# Patient Record
Sex: Female | Born: 1955 | Race: Black or African American | Hispanic: No | Marital: Single | Smoking: Never smoker
Health system: Southern US, Community
[De-identification: ages and names within clinical notes are randomized; demographics above are authoritative.]

---

## 2019-12-20 ENCOUNTER — Encounter (HOSPITAL_BASED_OUTPATIENT_CLINIC_OR_DEPARTMENT_OTHER): Payer: Self-pay | Admitting: Emergency Medicine

## 2019-12-20 ENCOUNTER — Emergency Department (HOSPITAL_BASED_OUTPATIENT_CLINIC_OR_DEPARTMENT_OTHER): Payer: Self-pay

## 2019-12-20 ENCOUNTER — Other Ambulatory Visit: Payer: Self-pay

## 2019-12-20 ENCOUNTER — Emergency Department (HOSPITAL_BASED_OUTPATIENT_CLINIC_OR_DEPARTMENT_OTHER)
Admission: EM | Admit: 2019-12-20 | Discharge: 2019-12-20 | Disposition: A | Discharge status: Home / Self Care | Payer: Self-pay | Attending: Emergency Medicine | Admitting: Emergency Medicine

## 2019-12-20 DIAGNOSIS — M25561 Pain in right knee: Secondary | ICD-10-CM | POA: Insufficient documentation

## 2019-12-20 DIAGNOSIS — R202 Paresthesia of skin: Secondary | ICD-10-CM | POA: Insufficient documentation

## 2019-12-20 DIAGNOSIS — M25511 Pain in right shoulder: Secondary | ICD-10-CM | POA: Insufficient documentation

## 2019-12-20 MED ORDER — ACETAMINOPHEN 500 MG PO TABS
1000.0000 mg | ORAL_TABLET | Freq: Once | ORAL | Status: AC
  Administered 2019-12-20: 1000 mg via ORAL
  Filled 2019-12-20: qty 2

## 2019-12-20 NOTE — ED Triage Notes (Signed)
Pt states she has been having right knee pain for 2 weeks.  Also having right shoulder pain for 2 weeks.  Pt also having numbness in fingers and toes intermittently for one month.  No weakness, no facial droop.

## 2019-12-20 NOTE — ED Provider Notes (Signed)
[facility] Provider Note   CSN: [id number] Arrival date & time: 12/20/19  9892     History Chief Complaint  Patient presents with  . Shoulder Pain  . Knee Pain  . Numbness    fingertips and toes    Latoya Ruiz is a 64 y.o. female.  Latoya Ruiz is a 64 y.o. female who is otherwise healthy, presents to the emergency department for evaluation of right knee pain, right shoulder pain and some intermittent numbness in the right fingertips and toes.  She states that a few weeks ago she noticed that her right knee was a bit swollen after being at work all day and looked like it had some fluid on the knee, she started wearing a knee brace and the swelling seemed to go away but she still has some pain over the right knee, she is able to walk without difficulty and denies any pain at the ankle or hip.  She has not noticed any redness or warmth.  She reports that this week she also started to have some right shoulder pain.  She reports that is most painful when she tries to reach overhead she denies any injury or trauma to the shoulder.  She denies any associated redness, swelling, no fevers.  She also states that over the past few weeks intermittently when she wakes up in the morning she notices some numbness in the first 3 fingers of her right hand and as well as the toes of her right foot.  She reports when she gets up and starts to move around this numbness goes away and does not recur throughout the day.  She denies any associated weakness.  No neck or back pain.  No headache or vision changes.  No chest pain or shortness of breath.  She has not taken any medications to treat the symptoms prior to arrival.  No other aggravating or alleviating factors. Pt does report a family history of rheumatoid arthritis.        No past medical history on file.  There are no problems to display for this patient.    OB History   No obstetric history on file.     No  family history on file.  Social History   Tobacco Use  . Smoking status: Never Smoker  . Smokeless tobacco: Never Used  Substance Use Topics  . Alcohol use: Yes    Comment: occasional  . Drug use: Never    Home Medications Prior to Admission medications   Not on File    Allergies    Patient has no known allergies.  Review of Systems   Review of Systems  Constitutional: Negative for chills and fever.  Respiratory: Negative for shortness of breath.   Cardiovascular: Negative for chest pain.  Musculoskeletal: Positive for arthralgias and joint swelling.  Skin: Negative for color change and rash.  Neurological: Positive for numbness. Negative for weakness and headaches.  All other systems reviewed and are negative.   Physical Exam Updated Vital Signs BP (!) 194/96   Pulse 78   Temp 98.4 F (36.9 C) (Oral)   Resp 16   Ht 5\' 6"  (1.676 m)   Wt 74.4 kg   SpO2 100%   BMI 26.47 kg/m   Physical Exam Vitals and nursing note reviewed.  Constitutional:      General: She is not in acute distress.    Appearance: She is well-developed. She is not diaphoretic.  HENT:  Head: Normocephalic and atraumatic.  Eyes:     General:        Right eye: No discharge.        Left eye: No discharge.  Neck:     Comments: No midline or paraspinal tenderness. Cardiovascular:     Rate and Rhythm: Normal rate and regular rhythm.     Heart sounds: Normal heart sounds. No murmur.  Pulmonary:     Effort: Pulmonary effort is normal. No respiratory distress.     Breath sounds: Normal breath sounds.     Comments: Respirations equal and unlabored, patient able to speak in full sentences, lungs clear to auscultation bilaterally Musculoskeletal:     Cervical back: No tenderness.     Comments: Right shoulder with tenderness to palpation over the glenohumeral joint, but no obvious deformity or swelling, no overlying erythema or warmth.  Active and passive range of motion intact with pain while  reaching overhead, no joint instability noted Right knee mildly tender to palpation over the anterior medial aspect, minimal swelling noted, no erythema or warmth, able to flex and extend greater than 90 degrees  Skin:    General: Skin is warm and dry.     Findings: No erythema or rash.  Neurological:     Mental Status: She is alert and oriented to person, place, and time.     Coordination: Coordination normal.  Psychiatric:        Mood and Affect: Mood normal.        Behavior: Behavior normal.     ED Results / Procedures / Treatments   Labs (all labs ordered are listed, but only abnormal results are displayed) Labs Reviewed - No data to display  EKG None  Radiology DG Shoulder Right  Result Date: 12/20/2019 CLINICAL DATA:  Right shoulder pain for 1 month EXAM: RIGHT SHOULDER - 2+ VIEW COMPARISON:  None. FINDINGS: There is no evidence of fracture or dislocation. Probable os acromiale. There is no evidence of arthropathy or other focal bone abnormality. Soft tissues are unremarkable. IMPRESSION: Negative. Electronically Signed   By: Duanne Guess D.O.   On: 12/20/2019 09:37   DG Knee Complete 4 Views Right  Result Date: 12/20/2019 CLINICAL DATA:  Right knee pain for 1 month EXAM: RIGHT KNEE - COMPLETE 4+ VIEW COMPARISON:  None. FINDINGS: No acute fracture or malalignment. Mild tricompartmental degenerative changes. Tiny enthesophyte at the quadriceps tendon insertion. Small knee joint effusion. Soft tissues within normal limits. IMPRESSION: 1. No acute osseous abnormality. 2. Mild tricompartmental degenerative changes. 3. Small knee joint effusion, nonspecific. Electronically Signed   By: Duanne Guess D.O.   On: 12/20/2019 09:39    Procedures Procedures (including critical care time)  Medications Ordered in ED Medications  acetaminophen (TYLENOL) tablet 1,000 mg (1,000 mg Oral Given 12/20/19 9628)    ED Course  I have reviewed the triage vital signs and the nursing  notes.  Pertinent labs & imaging results that were available during my care of the patient were reviewed by me and considered in my medical decision making (see chart for details).    MDM Rules/Calculators/A&P                      64 year old female presents with right knee pain and right shoulder pain, knee pain started a few weeks ago and shoulder pain began this week, she had some swelling over the right knee that has since improved.  Both joints without deformity or signs of infection  on exam, full range of motion intact.  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  Patient reports that sometimes when she wakes up from sleep but she has some numbness in the first 3 fingers of her right hand and occasionally her toes this goes away as soon as she gets up and starts moving around and does not continue, question whether she may have some carpal tunnel or other nerve inflammation, but she has no other neurologic deficits and has normal sensation and strength today.  Will have patient follow-up with sports medicine for continued evaluation of joint pains, will have her use Tylenol and Voltaren gel.  Return precautions discussed.  Patient expresses understanding and agreement with plan.  Discharged home in good condition.  Final Clinical Impression(s) / ED Diagnoses Final diagnoses:  Acute pain of right knee  Acute pain of right shoulder    Rx / DC Orders ED Discharge Orders    None       Legrand Rams 12/20/19 1013    Raeford Razor, MD 12/21/19 [phone]

## 2019-12-20 NOTE — Discharge Instructions (Addendum)
Your x-rays today shows some mild arthritis in your knee but are otherwise reassuring, please begin taking 1000 mg of Tylenol up to 3 times daily, and using over-the-counter Voltaren gel over your right knee and shoulder, you can continue to use knee brace to give support.  Please follow-up with sports medicine and/or use the phone number provided to establish care with her regular doctor.  If you develop significantly worsened swelling, redness, warmth or fevers, or have persistent numbness or tingling, return to the ED for reevaluation.

## 2021-10-11 IMAGING — CR DG SHOULDER 2+V*R*
3 series · 3 of 3 positions shown · non-contrast
Comparison: None.

CLINICAL DATA: Right shoulder pain for 1 month

EXAM:
RIGHT SHOULDER - 2+ VIEW

[w shoulder grashey right]
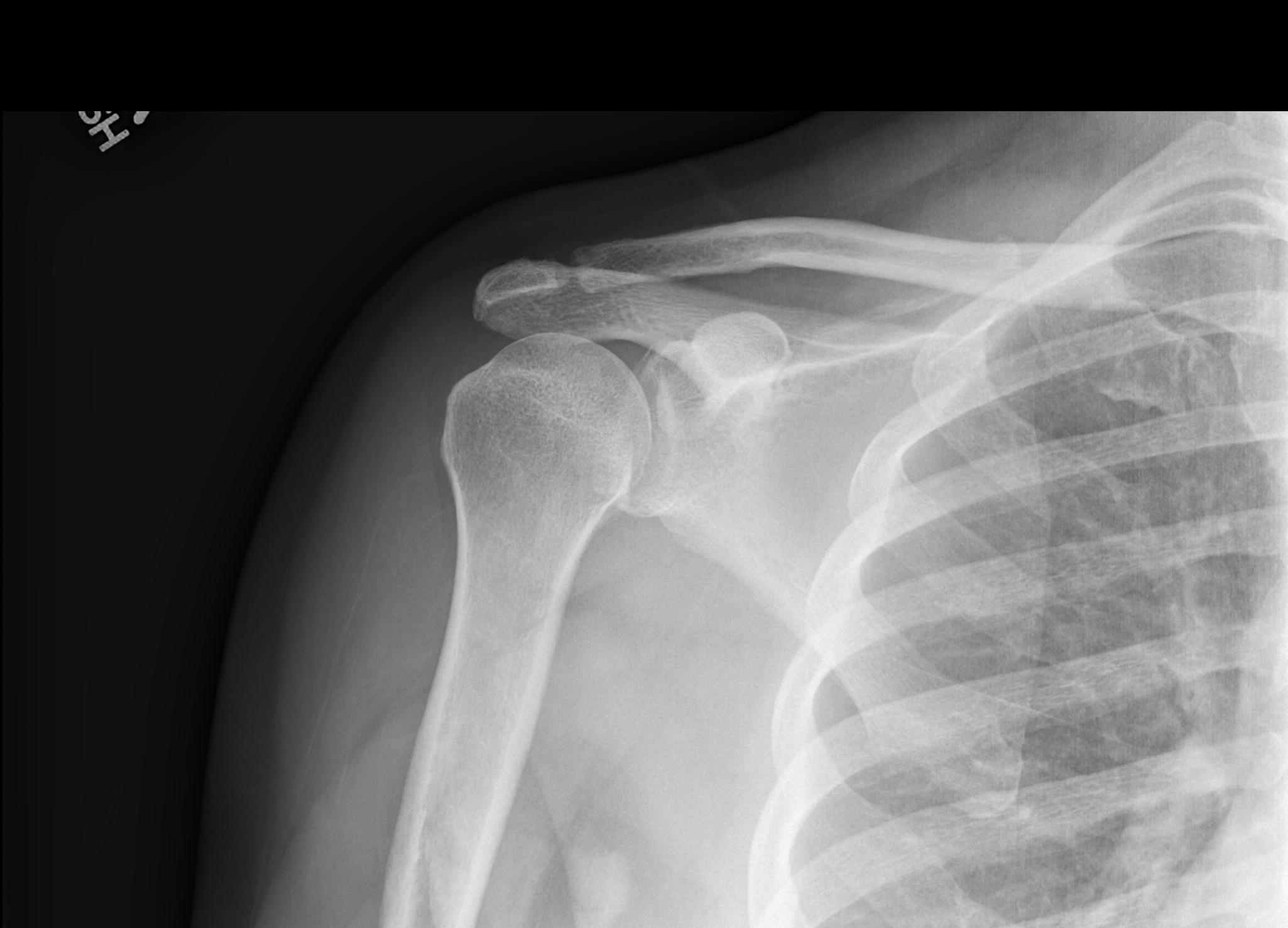

[w shoulder y view right]
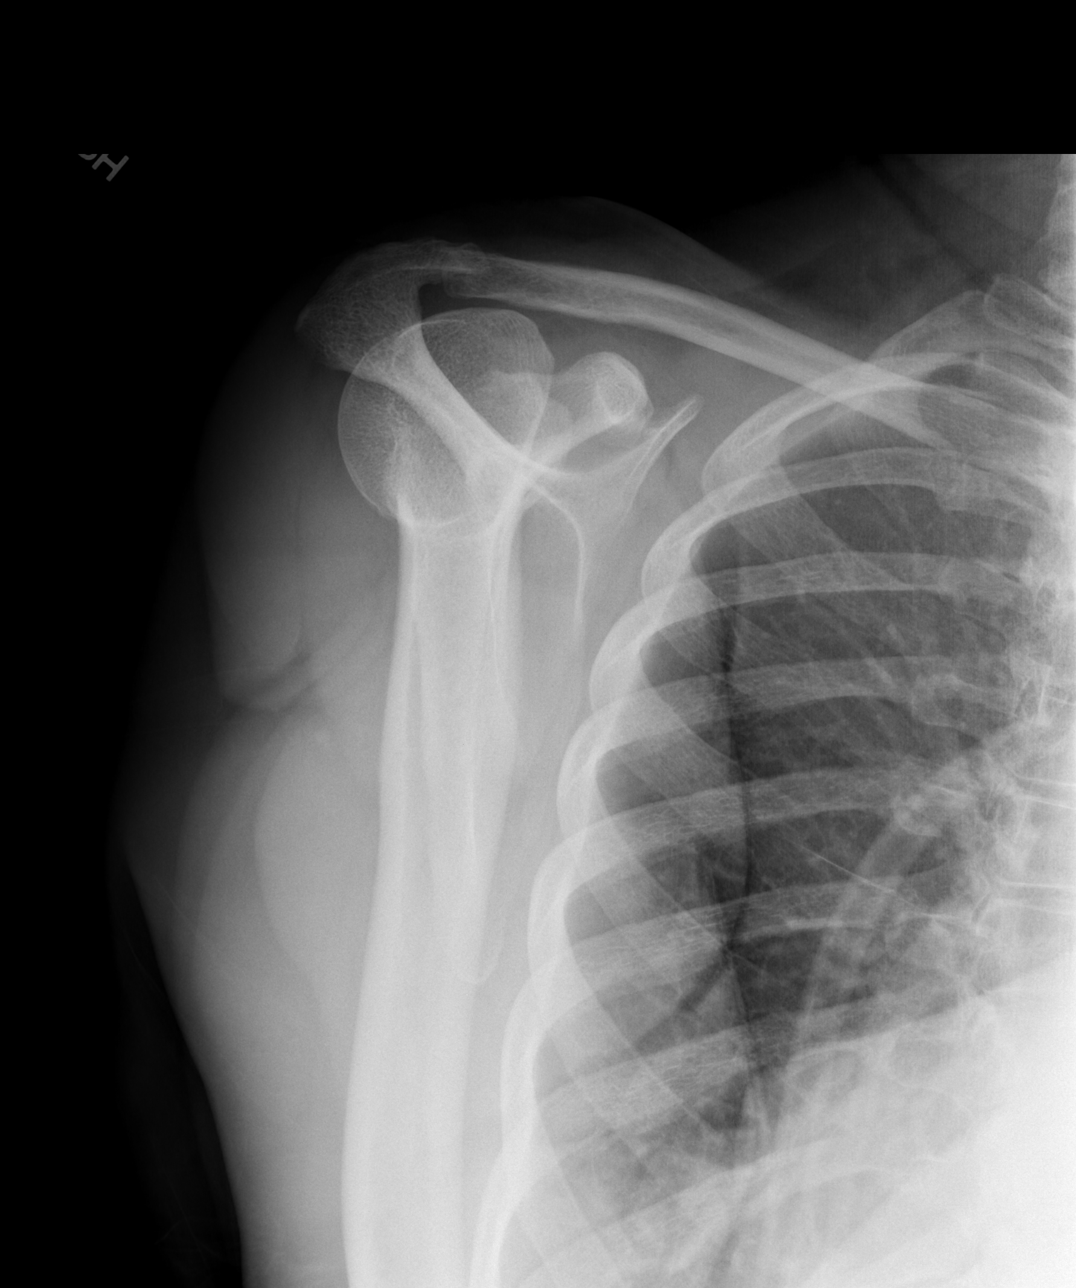

[x shoulder axillary right]
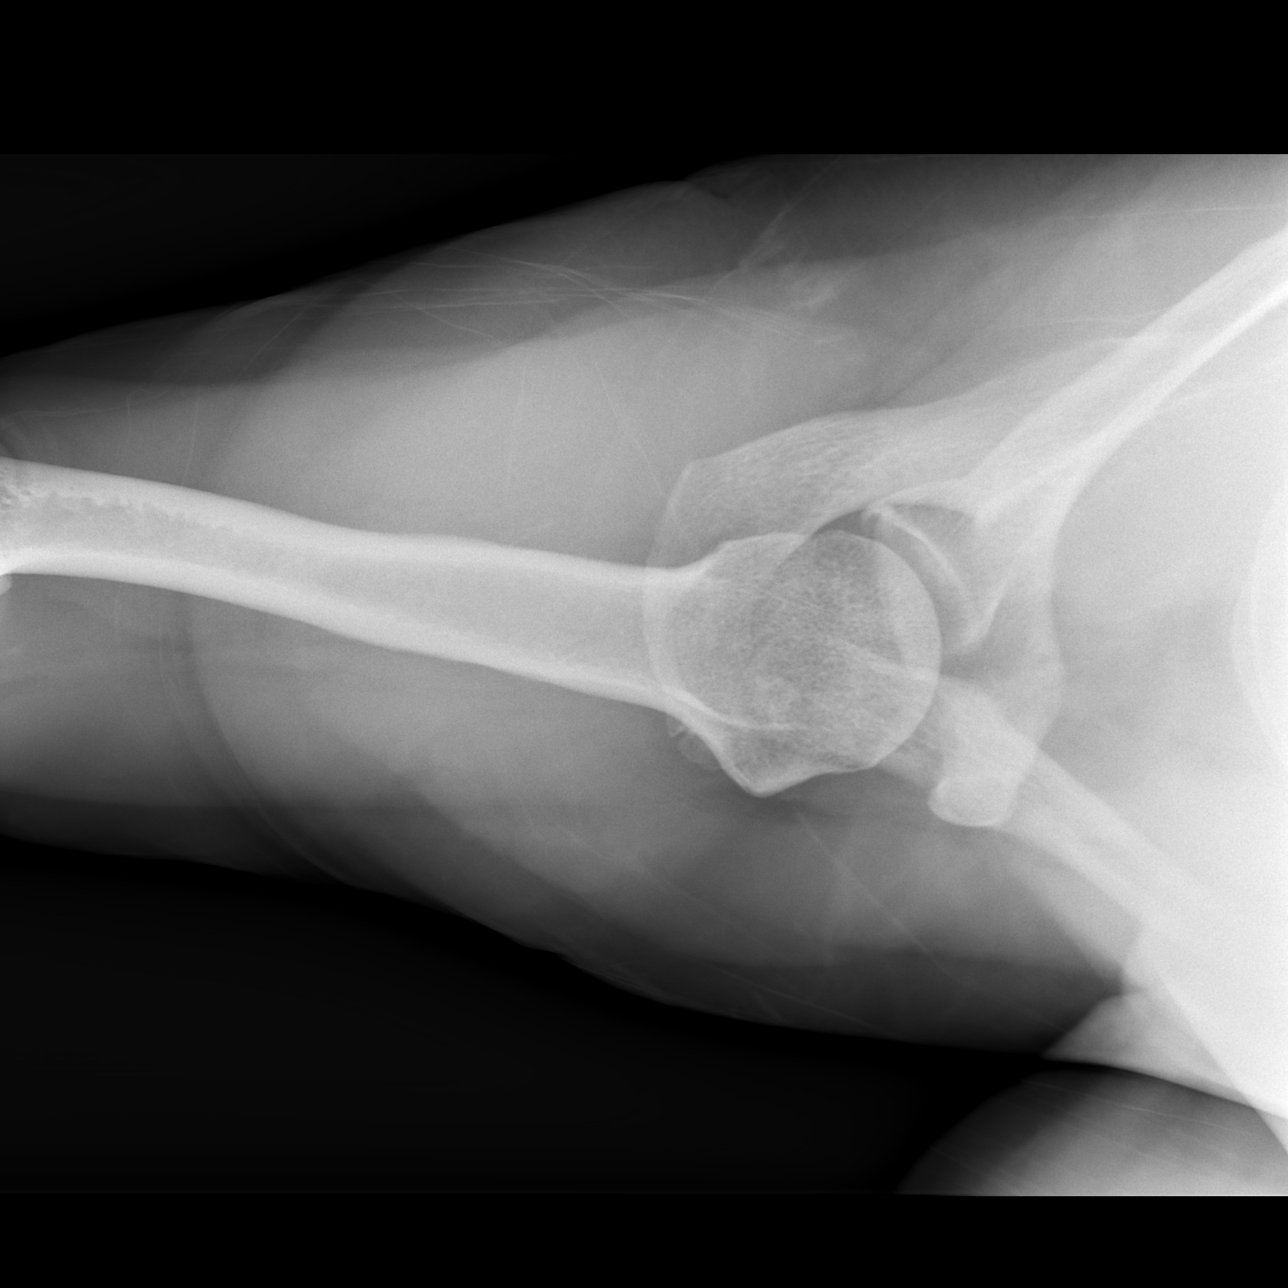

[3 of 3 positions shown; findings below may reference images not displayed]

FINDINGS: There is no evidence of fracture or dislocation. Probable os
acromiale. There is no evidence of arthropathy or other focal bone
abnormality. Soft tissues are unremarkable.
IMPRESSION: Negative.
